# Patient Record
Sex: Male | Born: 1985 | Race: White | Hispanic: No | Marital: Single | Smoking: Never smoker
Health system: Southern US, Community
[De-identification: ages and names within clinical notes are randomized; demographics above are authoritative.]

---

## 2012-11-10 ENCOUNTER — Institutional Professional Consult (permissible substitution): Payer: Self-pay | Admitting: Pulmonary Disease

## 2012-11-13 ENCOUNTER — Encounter: Payer: Self-pay | Admitting: Pulmonary Disease

## 2013-05-21 ENCOUNTER — Emergency Department (HOSPITAL_COMMUNITY)
Admission: EM | Admit: 2013-05-21 | Discharge: 2013-05-21 | Disposition: A | Discharge status: Home / Self Care | Payer: BC Managed Care – PPO | Source: Home / Self Care | Attending: Emergency Medicine | Admitting: Emergency Medicine

## 2013-05-21 ENCOUNTER — Encounter (HOSPITAL_COMMUNITY): Payer: Self-pay | Admitting: Emergency Medicine

## 2013-05-21 DIAGNOSIS — R591 Generalized enlarged lymph nodes: Secondary | ICD-10-CM

## 2013-05-21 DIAGNOSIS — J039 Acute tonsillitis, unspecified: Secondary | ICD-10-CM

## 2013-05-21 DIAGNOSIS — R599 Enlarged lymph nodes, unspecified: Secondary | ICD-10-CM

## 2013-05-21 LAB — POCT INFECTIOUS MONO SCREEN: Mono Screen: NEGATIVE

## 2013-05-21 LAB — POCT RAPID STREP A: Streptococcus, Group A Screen (Direct): NEGATIVE

## 2013-05-21 MED ORDER — AMOXICILLIN 500 MG PO CAPS: 500.0000 mg | ORAL_CAPSULE | Freq: Three times a day (TID) | ORAL | Status: AC

## 2013-05-21 MED ORDER — PREDNISONE 20 MG PO TABS: 20.0000 mg | ORAL_TABLET | Freq: Two times a day (BID) | ORAL | Status: AC

## 2013-05-21 NOTE — Discharge Instructions (Signed)

## 2013-05-21 NOTE — ED Provider Notes (Signed)
Chief Complaint   Chief Complaint  Patient presents with  . Mass    History of Present Illness   Audria Ninerevor Lowrimore is a 28 year old male who has had enlarged lymph nodes in the right and left neck for the past 2 days. They're swollen and tender to touch. He has sore throat and pain with swallowing, chills, subjective fever, nasal congestion. He had a similar episode about 2 months ago which went away on its own. He denies any coughing, wheezing, or shortness of breath. He's had no bowel pain, nausea, vomiting, or diarrhea. No known exposure to strep or to mono.  Review of Systems   Other than as noted above, the patient denies any of the following symptoms: Systemic:  No fevers or chills. Eye:  No redness, pain, discharge, itching, blurred vision, or diplopia. ENT:  No headache, nasal congestion, sneezing, itching, epistaxis, ear pain, decreased hearing, ringing in ears, vertigo, or tinnitus.  No oral lesions, sore throat, or hoarseness. Neck:  No neck pain or adenopathy. Skin:  No rash or itching.  PMFSH   Past medical history, family history, social history, meds, and allergies were reviewed.   Physical Examination     Vital signs:  BP 135/78  Pulse 114  Temp(Src) 98.7 F (37.1 C) (Oral)  Resp 24  SpO2 100% General:  Alert and oriented.  In no distress.  Skin warm and dry. Eye:  PERRL, full EOMs, lids and conjunctiva normal.   ENT:  TMs and canals clear.  Nasal mucosa not congested and without drainage.  Mucous membranes moist, no oral lesions, normal dentition, both tonsils were enlarged, right more so than left with spots of white exudate on both tonsils. There were no other intraoral lesions, no bulging of the anterior tonsillar pillar, uvula was midline.  No cranial or facial pain to palplation. Neck:  Supple, full ROM.  He has 2 enlarged, tender lymph nodes in the right posterior chain and one enlarged posterior lymph node on the left as well.  Thyroid normal. Lungs:  Breath  sounds clear and equal bilaterally.  No wheezes, rales or rhonchi. Heart:  Rhythm regular, without extrasystoles.  No gallops or murmers. Abdomen: Soft, nontender, no organomegaly or mass. Lymph nodes: No lymphadenopathy in the axillary or inguinal areas. Skin:  Clear, warm and dry.  Labs   Results for orders placed during the hospital encounter of 05/21/13  POCT RAPID STREP A ([facility] ONLY)      Result Value Ref Range   Streptococcus, Group A Screen (Direct) NEGATIVE  NEGATIVE  POCT INFECTIOUS MONO SCREEN      Result Value Ref Range   Mono Screen NEGATIVE  NEGATIVE    Assessment   The primary encounter diagnosis was Tonsillitis. A diagnosis of Adenopathy was also pertinent to this visit.  Lymphadenopathy appears to be reactive lymphadenopathy secondary to tonsillitis.  Plan    1.  Meds:  The following meds were prescribed:   Discharge Medication List as of 05/21/2013  9:13 PM    START taking these medications   Details  amoxicillin (AMOXIL) 500 MG capsule Take 1 capsule (500 mg total) by mouth 3 (three) times daily., Starting 05/21/2013, Until Discontinued, Normal    predniSONE (DELTASONE) 20 MG tablet Take 1 tablet (20 mg total) by mouth 2 (two) times daily., Starting 05/21/2013, Until Discontinued, Normal        2.  Patient Education/Counseling:  The patient was given appropriate handouts, self care instructions, and instructed in symptomatic relief.  3.  Follow up:  The patient was told to follow up here in 2 or 3 days for recheck, or sooner if becoming worse in any way, and given some red flag symptoms such as increasing fever, difficulty swallowing or difficulty breathing which would prompt immediate return.     Reuben Likesavid C Calandra Madura, MD 05/21/13 2252

## 2013-05-21 NOTE — ED Notes (Signed)
C/o mass on side of neck States the mass makes it painful to swallow Denies any discharge No treatments done

## 2013-05-23 LAB — CULTURE, GROUP A STREP

## 2013-05-25 ENCOUNTER — Encounter (HOSPITAL_COMMUNITY): Payer: Self-pay | Admitting: Emergency Medicine

## 2013-05-25 ENCOUNTER — Emergency Department (HOSPITAL_COMMUNITY)
Admission: EM | Admit: 2013-05-25 | Discharge: 2013-05-25 | Disposition: A | Discharge status: Home / Self Care | Payer: BC Managed Care – PPO | Source: Home / Self Care

## 2013-05-25 DIAGNOSIS — B2790 Infectious mononucleosis, unspecified without complication: Secondary | ICD-10-CM

## 2013-05-25 LAB — POCT INFECTIOUS MONO SCREEN: MONO SCREEN: POSITIVE — AB

## 2013-05-25 NOTE — Discharge Instructions (Signed)
Infectious Mononucleosis Infectious mononucleosis (mono) is a common germ (viral) infection in children, teenagers, and young adults.  CAUSES  Mono is an infection caused by the Malachi CarlEpstein Barr virus. The virus is spread by close personal contact with someone who has the infection. It can be passed by contact with your saliva through things such as kissing or sharing drinking glasses. Sometimes, the infection can be spread from someone who does not appear sick but still spreads the virus (asymptomatic carrier state).  SYMPTOMS  The most common symptoms of Mono are:  Sore throat.  Headache.  Fatigue.  Muscle aches.  Swollen glands.  Fever.  Poor appetite.  Enlarged liver or spleen. The less common symptoms can include:  Rash.  Feeling sick to your stomach (nauseous).  Abdominal pain. DIAGNOSIS  Mono is diagnosed by a blood test.  TREATMENT  Treatment of mono is usually at home. There is no medicine that cures this virus. Sometimes hospital treatment is needed in severe cases. Steroid medicine sometimes is needed if the swelling in the throat causes breathing or swallowing problems.  HOME CARE INSTRUCTIONS   Drink enough fluids to keep your urine clear or pale yellow.  Eat soft foods. Cool foods like popsicles or ice cream can soothe a sore throat.  Only take over-the-counter or prescription medicines for pain, discomfort, or fever as directed by your caregiver. Children under 10718 years of age should not take aspirin.  Gargle salt water. This may help relieve your sore throat. Put 1 teaspoon (tsp) of salt in 1 cup of warm water. Sucking on hard candy may also help.  Rest as needed.  Start regular activities gradually after the fever is gone. Be sure to rest when tired.  Avoid strenuous exercise or contact sports until your caregiver says it is okay. The liver and spleen could be seriously injured.  Avoid sharing drinking glasses or kissing until your caregiver tells you  that you are no longer contagious. SEEK MEDICAL CARE IF:   Your fever is not gone after 7 days.  Your activity level is not back to normal after 2 weeks.  You have yellow coloring to eyes and skin (jaundice). SEEK IMMEDIATE MEDICAL CARE IF:   You have severe pain in the abdomen or shoulder.  You have trouble swallowing or drooling.  You have trouble breathing.  You develop a stiff neck.  You develop a severe headache.  You cannot stop throwing up (vomiting).  You have convulsions.  You are confused.  You have trouble with balance.  You develop signs of body fluid loss (dehydration):  Weakness.  Sunken eyes.  Pale skin.  Dry mouth.  Rapid breathing or pulse. MAKE SURE YOU:   Understand these instructions.  Will watch your condition.  Will get help right away if you are not doing well or get worse. Document Released: 01/06/2000 Document Revised: 04/02/2011 Document Reviewed: 11/04/2007 [facility] Patient Information 2014 [place], [place].  Pharyngitis Pharyngitis is redness, pain, and swelling (inflammation) of your pharynx.  CAUSES  Pharyngitis is usually caused by infection. Most of the time, these infections are from viruses (viral) and are part of a cold. However, sometimes pharyngitis is caused by bacteria (bacterial). Pharyngitis can also be caused by allergies. Viral pharyngitis may be spread from person to person by coughing, sneezing, and personal items or utensils (cups, forks, spoons, toothbrushes). Bacterial pharyngitis may be spread from person to person by more intimate contact, such as kissing.  SIGNS AND SYMPTOMS  Symptoms of pharyngitis include:  Sore throat.   Tiredness (fatigue).   Low-grade fever.   Headache.  Joint pain and muscle aches.  Skin rashes.  Swollen lymph nodes.  Plaque-like film on throat or tonsils (often seen with bacterial pharyngitis). DIAGNOSIS  Your health care provider will ask you questions about your  illness and your symptoms. Your medical history, along with a physical exam, is often all that is needed to diagnose pharyngitis. Sometimes, a rapid strep test is done. Other lab tests may also be done, depending on the suspected cause.  TREATMENT  Viral pharyngitis will usually get better in 3 4 days without the use of medicine. Bacterial pharyngitis is treated with medicines that kill germs (antibiotics).  HOME CARE INSTRUCTIONS   Drink enough water and fluids to keep your urine clear or pale yellow.   Only take over-the-counter or prescription medicines as directed by your health care provider:   If you are prescribed antibiotics, make sure you finish them even if you start to feel better.   Do not take aspirin.   Get lots of rest.   Gargle with 8 oz of salt water ( tsp of salt per 1 qt of water) as often as every 1 2 hours to soothe your throat.   Throat lozenges (if you are not at risk for choking) or sprays may be used to soothe your throat. SEEK MEDICAL CARE IF:   You have large, tender lumps in your neck.  You have a rash.  You cough up green, yellow-brown, or bloody spit. SEEK IMMEDIATE MEDICAL CARE IF:   Your neck becomes stiff.  You drool or are unable to swallow liquids.  You vomit or are unable to keep medicines or liquids down.  You have severe pain that does not go away with the use of recommended medicines.  You have trouble breathing (not caused by a stuffy nose). MAKE SURE YOU:   Understand these instructions.  Will watch your condition.  Will get help right away if you are not doing well or get worse. Document Released: 01/08/2005 Document Revised: 10/29/2012 Document Reviewed: 09/15/2012 [facility] Patient Information 2014 [place], [place].

## 2013-05-25 NOTE — ED Notes (Signed)
Sore throat, hard to swallow, tonsils enlarged , swollen, red

## 2013-05-25 NOTE — ED Provider Notes (Signed)
Medical screening examination/treatment/procedure(s) were performed by non-physician practitioner and as supervising physician I was immediately available for consultation/collaboration.  Mukund Weinreb, M.D.  Kieren Ricci C Jorie Zee, MD 05/25/13 1940 

## 2013-05-25 NOTE — ED Provider Notes (Signed)
CSN: [id number]     Arrival date & time 05/25/13  0912 History   First MD Initiated Contact with Patient 05/25/13 [phone]     Chief Complaint  Patient presents with  . Sore Throat   (Consider location/radiation/quality/duration/timing/severity/associated sxs/prior Treatment) HPI Comments: Was seen here by Dr. Lorenz CoasterKeller 4 d ago for tonsillitis. Strep and mono screens neg. Placed on amoxicillin and prednisone. Improved first 3 d but worse this AM., esp with swallowing.   History reviewed. No pertinent past medical history. History reviewed. No pertinent past surgical history. History reviewed. No pertinent family history. History  Substance Use Topics  . Smoking status: Never Smoker   . Smokeless tobacco: Not on file  . Alcohol Use: Yes    Review of Systems  Constitutional: Positive for fever.  HENT: Positive for sore throat. Negative for congestion.   Respiratory: Negative.   Gastrointestinal: Negative.   Genitourinary: Negative.   Neurological: Negative.     Allergies  Review of patient's allergies indicates no known allergies.  Home Medications   Prior to Admission medications   Medication Sig Start Date End Date Taking? Authorizing Provider  amoxicillin (AMOXIL) 500 MG capsule Take 1 capsule (500 mg total) by mouth 3 (three) times daily. 05/21/13  Yes Reuben Likesavid C Keller, MD  predniSONE (DELTASONE) 20 MG tablet Take 1 tablet (20 mg total) by mouth 2 (two) times daily. 05/21/13  Yes Reuben Likesavid C Keller, MD   BP 145/77  Pulse 62  Temp(Src) 99.5 F (37.5 C) (Oral)  Resp 18  SpO2 98% Physical Exam  Nursing note and vitals reviewed. Constitutional: He is oriented to person, place, and time. He appears well-developed and well-nourished. No distress.  HENT:  OP with bilateral tonsillar enlargement, erythema and exudates. No abscess formation. No uvula shift. Airway is patent.  Dr. Lorenz CoasterKeller was here to view the OP and st he does not believe it has changed.   Eyes: Conjunctivae and EOM are  normal.  Neck: Normal range of motion.  Cardiovascular: Normal rate and regular rhythm.   Pulmonary/Chest: Effort normal and breath sounds normal.  Lymphadenopathy:    He has cervical adenopathy.  Neurological: He is alert and oriented to person, place, and time.  Skin: Skin is warm and dry.  Psychiatric: He has a normal mood and affect.    ED Course  Procedures (including critical care time) Labs Review Labs Reviewed  POCT INFECTIOUS MONO SCREEN - Abnormal; Notable for the following:    Mono Screen POSITIVE (*)    All other components within normal limits    Imaging Review No results found.   MDM   1. Mononucleosis     repeat mono is positive. cepacol loz Ibuprofen  Lots of fluids   Hayden Rasmussenavid Nazim Kadlec, NP 05/25/13 1025

## 2013-05-30 ENCOUNTER — Encounter (HOSPITAL_COMMUNITY): Payer: Self-pay | Admitting: Emergency Medicine

## 2013-05-30 ENCOUNTER — Emergency Department (HOSPITAL_COMMUNITY)
Admission: EM | Admit: 2013-05-30 | Discharge: 2013-05-30 | Disposition: A | Discharge status: Home / Self Care | Payer: BC Managed Care – PPO | Source: Home / Self Care | Attending: Emergency Medicine | Admitting: Emergency Medicine

## 2013-05-30 DIAGNOSIS — L270 Generalized skin eruption due to drugs and medicaments taken internally: Secondary | ICD-10-CM

## 2013-05-30 DIAGNOSIS — T360X5A Adverse effect of penicillins, initial encounter: Principal | ICD-10-CM

## 2013-05-30 MED ORDER — PREDNISONE 20 MG PO TABS: 20.0000 mg | ORAL_TABLET | Freq: Two times a day (BID) | ORAL | Status: AC

## 2013-05-30 NOTE — ED Provider Notes (Signed)
  Chief Complaint    Chief Complaint  Patient presents with  . Rash    History of Present Illness      Rodney Esparza is a 28 year old male who presents with a two-day history of a skin rash. He has been seen here several times with sore throat which was ultimately diagnosed as being an infectious mononucleosis. He initially was treated with amoxicillin and prednisone. He finished the prednisone, but kept taking the amoxicillin. He has never acted to amoxicillin before. He states his mono symptoms are resolving and right now he denies any fever, chills, headache, sore throat, or swollen glands. He has had no coughing, nasal congestion, or GI symptoms. His only complaint is been a rash. It's mildly pruritic. He denies any difficulty breathing.  Review of Systems   Other than as noted above, the patient denies any of the following symptoms: Systemic:  No fever or chills. ENT:  No nasal congestion, rhinorrhea, sore throat, swelling of lips, tongue or throat. Resp:  No cough, wheezing, or shortness of breath.  PMFSH    Past medical history, family history, social history, meds, and allergies were reviewed.   Physical Exam     Vital signs:  BP 144/78  Pulse 107  Temp(Src) 98.7 F (37.1 C) (Oral)  Resp 16  SpO2 97% Gen:  Alert, oriented, in no distress. ENT:  Tonsils are enlarged but decreasing in size, there is no white exudate, no intraoral lesions, moist mucous membranes. Neck: No cervical lymphadenopathy. Lungs:  Clear to auscultation. Skin:  There is a fine, erythematous maculopapular rash on the face, neck, trunk, and extremities.  Assessment    The encounter diagnosis was Amoxicillin rash.  I do not think this is a true allergy to amoxicillin or penicillin. I think this is the amoxicillin rash that can occur with infectious mononucleosis. He has stopped the amoxicillin. I think he can try again in the future, but I urged her to be careful and to observe how he reacted to  it.  Plan     1.  Meds:  The following meds were prescribed:   Discharge Medication List as of 05/30/2013 10:06 AM    START taking these medications   Details  !! predniSONE (DELTASONE) 20 MG tablet Take 1 tablet (20 mg total) by mouth 2 (two) times daily., Starting 05/30/2013, Until Discontinued, Normal     !! - Potential duplicate medications found. Please discuss with provider.      2.  Patient Education/Counseling:  The patient was given appropriate handouts, self care instructions, and instructed in symptomatic relief.    3.  Follow up:  The patient was told to follow up here if no better in 3 to 4 days, or sooner if becoming worse in any way, and given some red flag symptoms such as worsening rash, fever, or difficulty breathing which would prompt immediate return.  Follow up here if necessary.      Reuben Likesavid C Quintez Maselli, MD 05/30/13 1159

## 2013-05-30 NOTE — Discharge Instructions (Signed)
Drug Rash Skin reactions can be caused by several different drugs. Allergy to the medicine can cause itching, hives, and other rashes. Sun exposure causes a red rash with some medicines. Mononucleosis virus can cause a similar red rash when you are taking antibiotics. Sometimes, the rash may be accompanied by pain. The drug rash may happen with new drugs or with medicines that you have been taking for a while. The rash cannot be spread from person to person. In most cases, the symptoms of a drug rash are gone within a few days of stopping the medicine. Your rash, including hives (urticaria), is most likely from the following medicines:  Antibiotics or antimicrobials.  Anticonvulsants or seizure medicines.  Antihypertensives or blood pressure medicines.  Antimalarials.  Antidepressants or depression medicines.  Antianxiety drugs.  Diuretics or water pills.  Nonsteroidal anti-inflammatory drugs.  Simvastatin.  Lithium.  Omeprazole.  Allopurinol.  Pseudoephedrine.  Amiodarone.  Packed red blood cells, when you get a blood transfusion.  Contrast media, such as when getting an imaging test (CT or CAT scan). This drug list is not all inclusive, but drug rashes have been reported with all the medicines listed above.Your caregiver will tell you which medicines to avoid. If you react to a medicine, a similar or worse reaction can occur the next time you take it. If you need to stop taking an antibiotic because of a drug rash, an alternative antibiotic may be needed to get rid of your infection. Antihistamine or cortisone drugs may be prescribed to help relieve your symptoms. Stay out of the sun until the rash is completely gone.  Be sure to let your caregiver know about your drug reaction. Do not take this medicine in the future. Call your caregiver if your drug rash does not improve within 3 to 4 days. SEEK IMMEDIATE MEDICAL CARE IF:   You develop breathing problems, swelling in the  throat, or wheezing.  You have weakness, fainting, fever, and muscle or joint pains.  You develop blisters or peeling of skin, especially around the mouth. Document Released: 02/16/2004 Document Revised: 04/02/2011 Document Reviewed: 11/27/2007 [employer] Patient Information 2014 [employer].  

## 2013-05-30 NOTE — ED Notes (Signed)
Pt  Reports     Rash      On  Both  Arms        And  Trunk  -  Which            He  Noticed  yest                He   Has  Been  On  Amoxicillin           For  An  Infection         He  Stopped  The med  After  Noticing the  Rash   He  Is  Awake  And  Alert   And  Appears  In no  Acute  Distress  Speaking in  Complete  sentances

## 2013-10-01 ENCOUNTER — Ambulatory Visit: Payer: Self-pay

## 2013-10-01 ENCOUNTER — Other Ambulatory Visit: Payer: Self-pay | Admitting: Occupational Medicine

## 2013-10-01 DIAGNOSIS — M25562 Pain in left knee: Secondary | ICD-10-CM

## 2015-11-19 IMAGING — CR DG KNEE COMPLETE 4+V*L*
5 series · 5 of 5 positions shown · non-contrast
Comparison: None.

CLINICAL DATA: Left knee pain.

EXAM:
LEFT KNEE - COMPLETE 4+ VIEW

[view not recorded (1 of 5)]
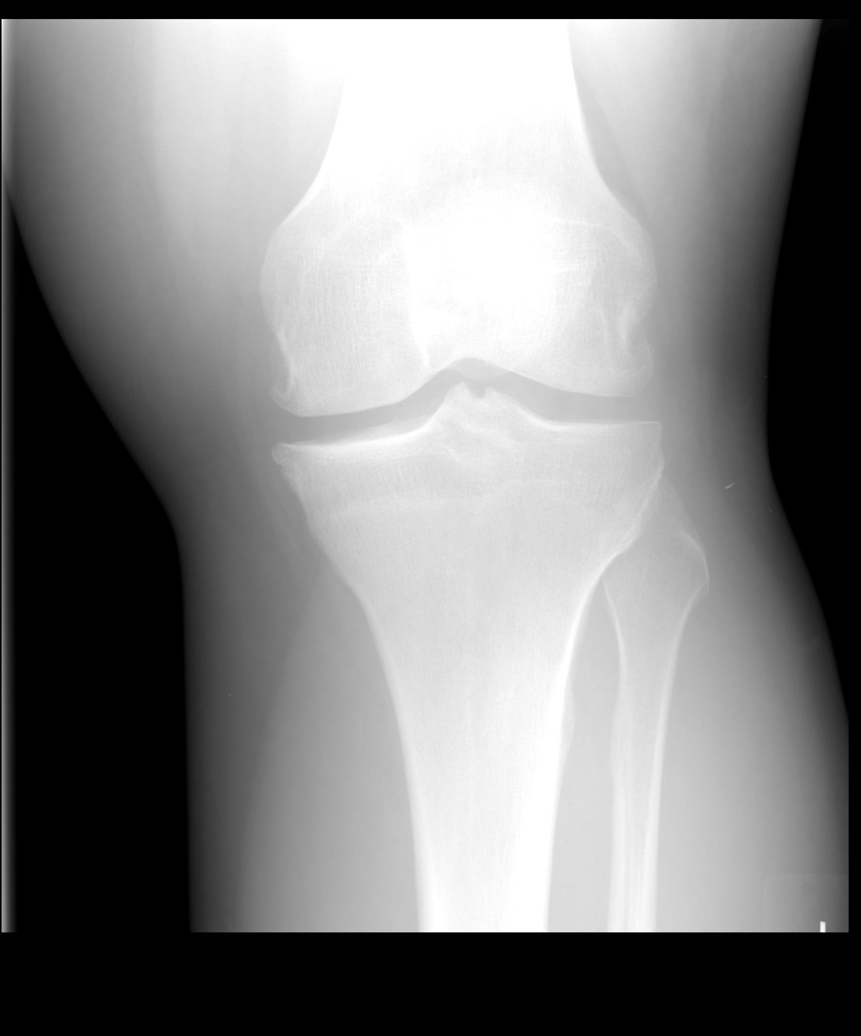

[view not recorded (2 of 5)]
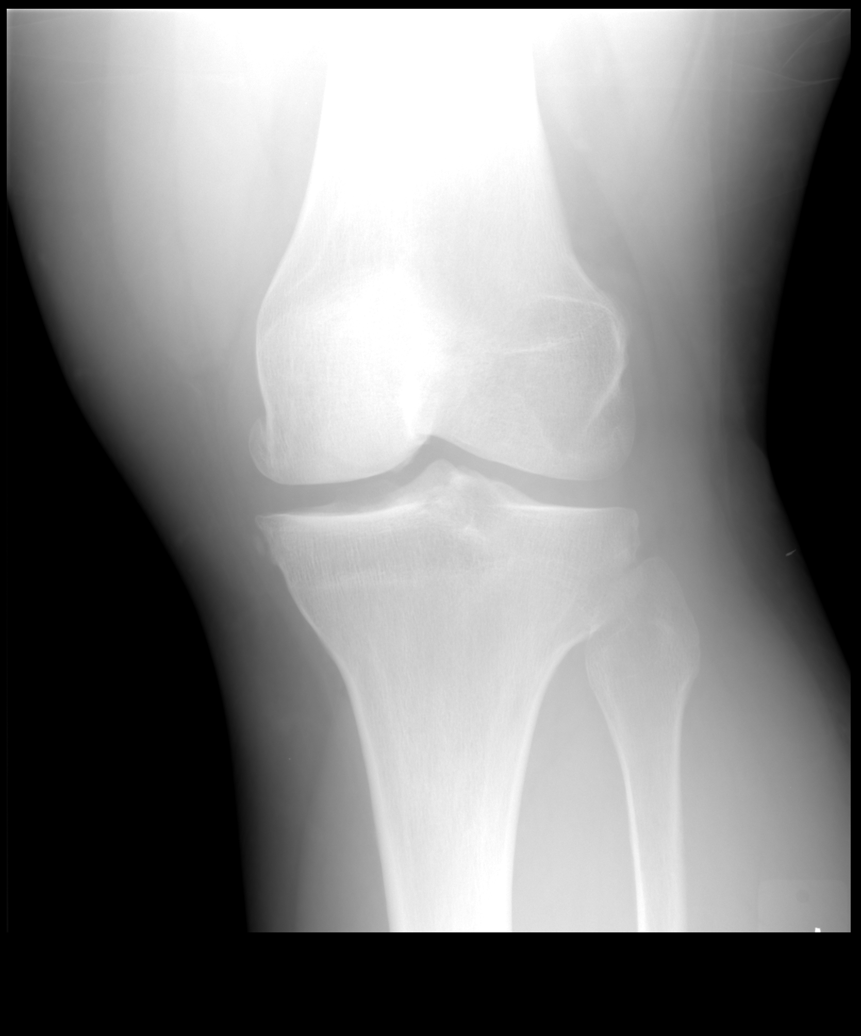

[view not recorded (3 of 5)]
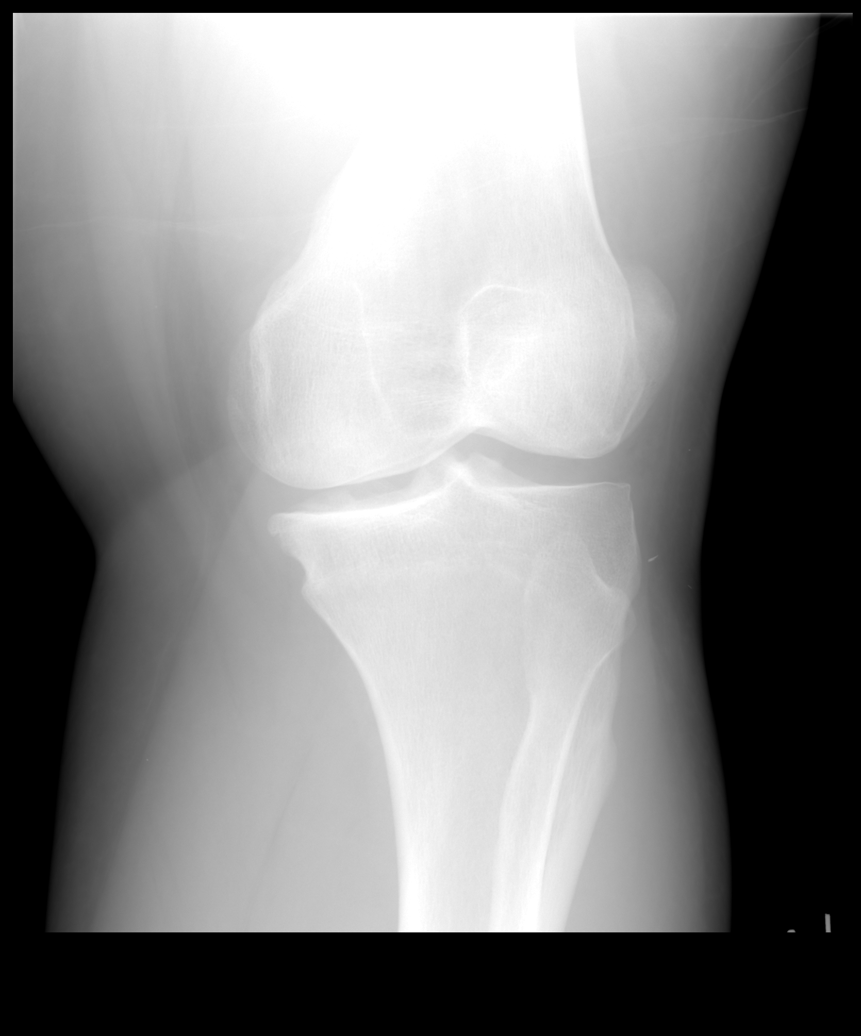

[view not recorded (4 of 5)]
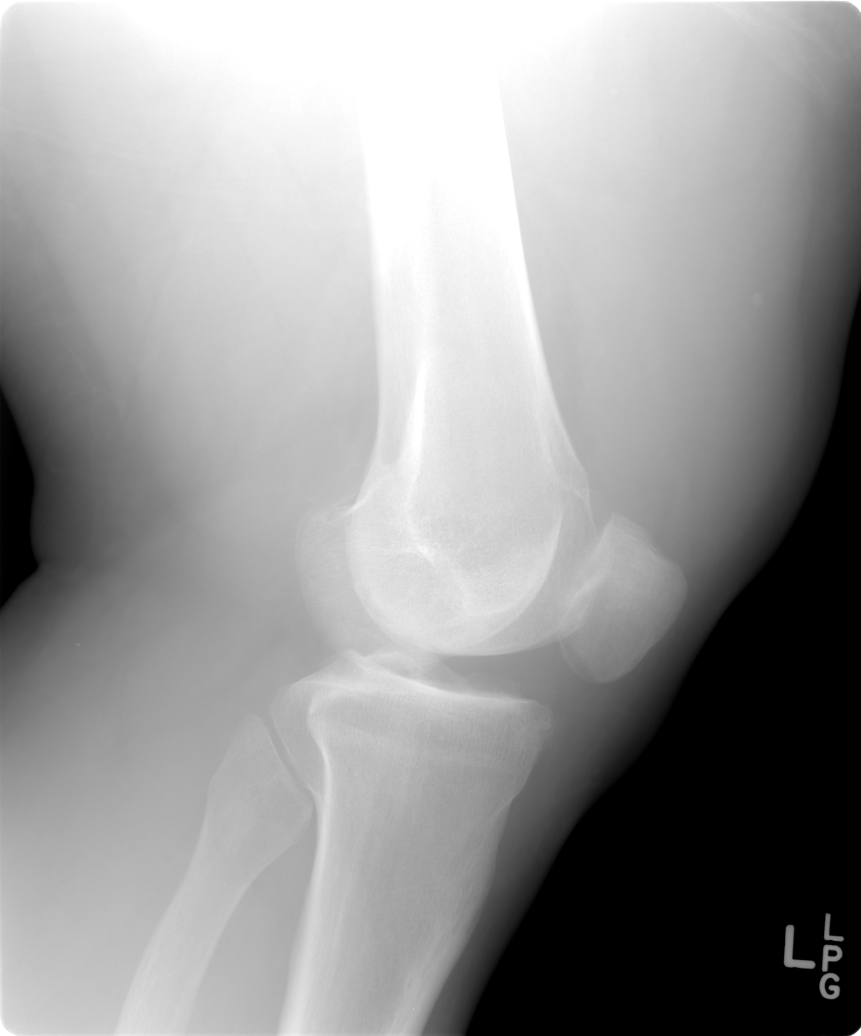

[view not recorded (5 of 5)]
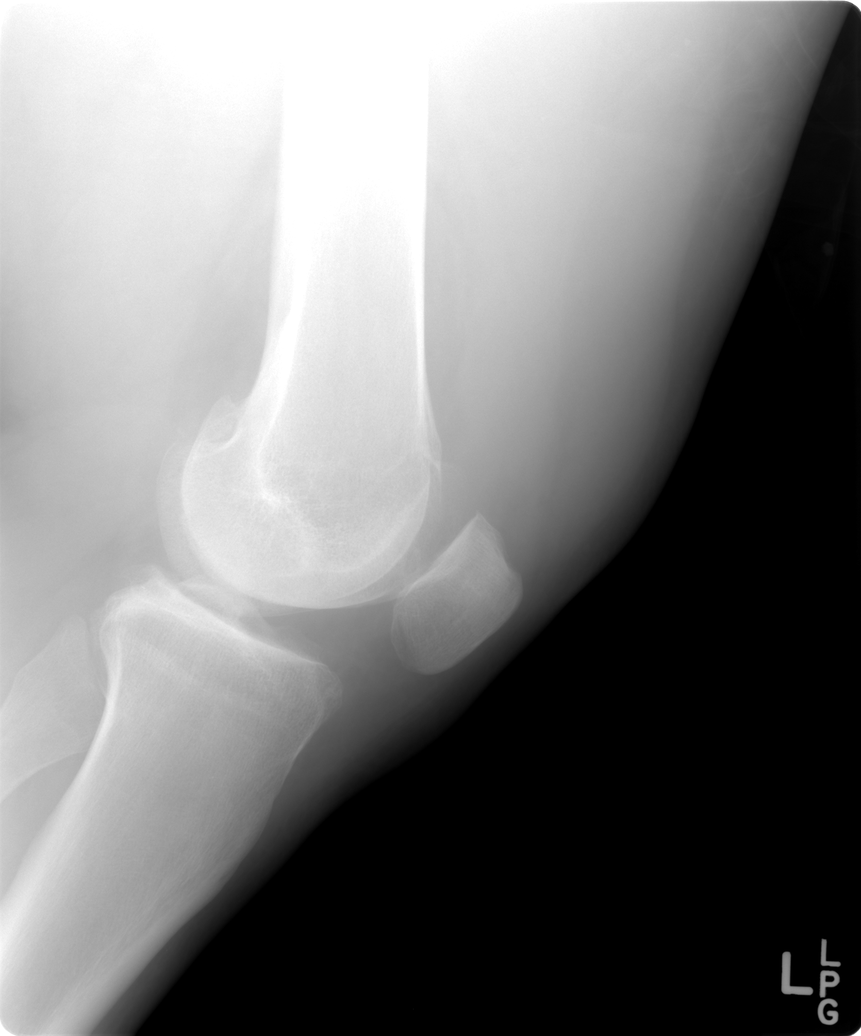

[5 of 5 positions shown; findings below may reference images not displayed]

FINDINGS: Tiny radiopacity is noted in the subcutaneous region of the lateral
aspect of the left knee. Tiny foreign body cannot be excluded. Knee
joint effusion cannot be excluded. Tricompartment degenerative
changes. Tiny bony density noted adjacent to the medial tibial
plateau may represent ligamentous calcification. No fracture. No
dislocation .
IMPRESSION: 1. Tiny radiopacity noted over the subcutaneous portion of the
lateral aspect of the left knee. This could represent tiny foreign
body. This could represent film artifact.
2. Cannot exclude knee joint effusion.
3. Calcific density noted adjacent to the medial tibial plateau,
possibly ligamentous calcification.
4. Tricompartment degenerative change, most prominent about the
medial and patellofemoral compartments. No acute bony abnormality.
